# Patient Record
Sex: Male | Born: 1995 | Race: Black or African American | Hispanic: No | Marital: Single | Smoking: Never smoker
Health system: Southern US, Community
[De-identification: ages and names within clinical notes are randomized; demographics above are authoritative.]

## PROBLEM LIST (undated history)

## (undated) DIAGNOSIS — G4730 Sleep apnea, unspecified: Secondary | ICD-10-CM

## (undated) DIAGNOSIS — K219 Gastro-esophageal reflux disease without esophagitis: Secondary | ICD-10-CM

## (undated) DIAGNOSIS — J45909 Unspecified asthma, uncomplicated: Secondary | ICD-10-CM

## (undated) HISTORY — PX: TONSILLECTOMY: SUR1361

---
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
## 2015-02-22 ENCOUNTER — Other Ambulatory Visit (HOSPITAL_COMMUNITY): Payer: Self-pay | Admitting: Respiratory Therapy

## 2015-02-22 DIAGNOSIS — R0602 Shortness of breath: Secondary | ICD-10-CM

## 2016-04-20 ENCOUNTER — Encounter: Payer: Self-pay | Admitting: Gastroenterology

## 2016-04-21 ENCOUNTER — Other Ambulatory Visit (HOSPITAL_COMMUNITY): Payer: Self-pay | Admitting: Pulmonary Disease

## 2016-04-21 DIAGNOSIS — R1012 Left upper quadrant pain: Secondary | ICD-10-CM

## 2016-05-14 ENCOUNTER — Ambulatory Visit: Payer: BC Managed Care – PPO | Admitting: Nurse Practitioner

## 2016-05-15 ENCOUNTER — Other Ambulatory Visit: Payer: Self-pay | Admitting: Occupational Medicine

## 2016-05-15 ENCOUNTER — Ambulatory Visit: Payer: Self-pay

## 2016-05-15 DIAGNOSIS — R0781 Pleurodynia: Secondary | ICD-10-CM

## 2017-01-17 ENCOUNTER — Emergency Department (HOSPITAL_COMMUNITY)
Admission: EM | Admit: 2017-01-17 | Discharge: 2017-01-17 | Disposition: A | Discharge status: Home / Self Care | Payer: No Typology Code available for payment source | Attending: Emergency Medicine | Admitting: Emergency Medicine

## 2017-01-17 ENCOUNTER — Emergency Department (HOSPITAL_COMMUNITY): Payer: No Typology Code available for payment source

## 2017-01-17 DIAGNOSIS — M25561 Pain in right knee: Secondary | ICD-10-CM | POA: Diagnosis not present

## 2017-01-17 DIAGNOSIS — Y939 Activity, unspecified: Secondary | ICD-10-CM | POA: Insufficient documentation

## 2017-01-17 DIAGNOSIS — Y999 Unspecified external cause status: Secondary | ICD-10-CM | POA: Diagnosis not present

## 2017-01-17 DIAGNOSIS — J45909 Unspecified asthma, uncomplicated: Secondary | ICD-10-CM | POA: Diagnosis not present

## 2017-01-17 DIAGNOSIS — M542 Cervicalgia: Secondary | ICD-10-CM

## 2017-01-17 DIAGNOSIS — Y92410 Unspecified street and highway as the place of occurrence of the external cause: Secondary | ICD-10-CM | POA: Diagnosis not present

## 2017-01-17 DIAGNOSIS — Z79899 Other long term (current) drug therapy: Secondary | ICD-10-CM | POA: Diagnosis not present

## 2017-01-17 DIAGNOSIS — S199XXA Unspecified injury of neck, initial encounter: Secondary | ICD-10-CM | POA: Diagnosis not present

## 2017-01-17 DIAGNOSIS — M545 Low back pain, unspecified: Secondary | ICD-10-CM

## 2017-01-17 MED ORDER — CYCLOBENZAPRINE HCL 10 MG PO TABS: 10.0000 mg | ORAL_TABLET | Freq: Every day | ORAL | 0 refills | Status: AC
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
## 2017-01-17 NOTE — Discharge Instructions (Signed)
Alternate ibuprofen and Tylenol every 3 hours for pain control and take Flexeril at night for muscle relaxation but do not drive or operate machinery with Flexeril use. Ice to areas of soreness for the next few days and then may move to heat. Expect to be sore for the next few day and follow up with primary care physician or orthopedic doctor for recheck of ongoing symptoms but return to ER for emergent changing or worsening of symptoms.
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
## 2017-09-01 ENCOUNTER — Other Ambulatory Visit (HOSPITAL_BASED_OUTPATIENT_CLINIC_OR_DEPARTMENT_OTHER): Payer: Self-pay

## 2017-09-01 DIAGNOSIS — G4730 Sleep apnea, unspecified: Secondary | ICD-10-CM

## 2017-09-02 ENCOUNTER — Encounter (INDEPENDENT_AMBULATORY_CARE_PROVIDER_SITE_OTHER): Payer: Self-pay

## 2017-09-02 ENCOUNTER — Ambulatory Visit: Payer: BC Managed Care – PPO | Attending: Pulmonary Disease | Admitting: Neurology

## 2017-09-02 DIAGNOSIS — G4730 Sleep apnea, unspecified: Secondary | ICD-10-CM

## 2017-09-02 DIAGNOSIS — Z79899 Other long term (current) drug therapy: Secondary | ICD-10-CM | POA: Insufficient documentation

## 2017-09-02 DIAGNOSIS — G4733 Obstructive sleep apnea (adult) (pediatric): Secondary | ICD-10-CM | POA: Insufficient documentation

## 2017-09-05 NOTE — Procedures (Signed)
[facility] A. Merlene Laughter, Tristan Vance     [email or web address]             NOCTURNAL POLYSOMNOGRAPHY   LOCATION: [facility]   Patient Name: Tristan Vance, Tristan Vance Date: 09/02/2017 Gender: Male D.O.B: [birth year] Age (years): 21 Referring Provider: Sinda Du Height (inches): 72 Interpreting Physician: Tristan Vance, Tristan Vance Weight (lbs): 352 RPSGT: Peak, Robert BMI: 48 MRN: [id number] Neck Size: 18.00 CLINICAL INFORMATION Sleep Study Type: Split Night CPAP  Indication for sleep study: N/A  Epworth Sleepiness Score: 9  SLEEP STUDY TECHNIQUE As per the AASM Manual for the Scoring of Sleep and Associated Events v2.3 (April 2016) with a hypopnea requiring 4% desaturations.  The channels recorded and monitored were frontal, central and occipital EEG, electrooculogram (EOG), submentalis EMG (chin), nasal and oral airflow, thoracic and abdominal wall motion, anterior tibialis EMG, snore microphone, electrocardiogram, and pulse oximetry. Continuous positive airway pressure (CPAP) was initiated when the patient met split night criteria and was titrated according to treat sleep-disordered breathing.  MEDICATIONS Medications self-administered by patient taken the night of the study : N/A  Current Outpatient Medications:  .  bethanechol (URECHOLINE) 5 MG tablet, Take 1 tablet (5 mg total) by mouth 3 (three) times daily before meals., Disp: 100 tablet, Rfl: 5 .  cetirizine (ZYRTEC) 10 MG tablet, Take 10 mg by mouth daily., Disp: , Rfl:  .  esomeprazole (NEXIUM) 40 MG capsule, Take 1 capsule (40 mg total) by mouth daily., Disp: 30 capsule, Rfl: 0 .  mometasone (ELOCON) 0.1 % cream, Apply 1 application topically daily., Disp: , Rfl:   RESPIRATORY PARAMETERS Diagnostic  Total AHI (/hr): 29.2 RDI (/hr): 30.8 OA Index (/hr): 14 CA Index (/hr): 0.6 REM AHI (/hr): 51.4 NREM AHI (/hr): 28.8 Supine AHI (/hr): 42.4 Non-supine AHI (/hr): 19.63 Min O2 Sat (%): 79.00 Mean O2  (%): 93.19 Time below 88% (min): 10.7   Titration  Optimal Pressure (cm): 11 AHI at Optimal Pressure (/hr): 1.3 Min O2 at Optimal Pressure (%): 92.0 Supine % at Optimal (%): 100 Sleep % at Optimal (%): 99   SLEEP ARCHITECTURE The recording time for the entire night was 404.2 minutes.  During a baseline period of 238.6 minutes, the patient slept for 189.0 minutes in REM and nonREM, yielding a sleep efficiency of 79.2%. Sleep onset after lights out was 0.6 minutes with a REM latency of 138.5 minutes. The patient spent 10.05% of the night in stage N1 sleep, 73.54% in stage N2 sleep, 14.55% in stage N3 and 1.85% in REM.  During the titration period of 161.1 minutes, the patient slept for 131.5 minutes in REM and nonREM, yielding a sleep efficiency of 81.6%. Sleep onset after CPAP initiation was 3.0 minutes with a REM latency of 40.5 minutes. The patient spent 2.66% of the night in stage N1 sleep, 41.44% in stage N2 sleep, 21.29% in stage N3 and 34.60% in REM.  CARDIAC DATA The 2 lead EKG demonstrated sinus rhythm. The mean heart rate was 75.45 beats per minute. Other EKG findings include: None. LEG MOVEMENT DATA The total Periodic Limb Movements of Sleep (PLMS) were 0. The PLMS index was 0.00.  IMPRESSIONS Moderate obstructive sleep apnea occurred during the diagnostic portion of the study(AHI = 29.2/hour). The optimal CPAP is  ( 11 cm of water).  Tristan Metz, Tristan Vance Diplomate, American Board of Sleep Medicine.  ELECTRONICALLY SIGNED ON:  09/05/2017, 9:31 PM [place] PH: [phone]   FX: [phone] ACCREDITED BY THE  [place]

## 2017-12-09 ENCOUNTER — Ambulatory Visit: Payer: Self-pay | Admitting: Nutrition

## 2017-12-09 ENCOUNTER — Telehealth: Payer: Self-pay | Admitting: Nutrition

## 2017-12-09 NOTE — Telephone Encounter (Signed)
Unable to leave VM. Phone busy

## 2018-12-29 ENCOUNTER — Emergency Department (HOSPITAL_COMMUNITY): Payer: BC Managed Care – PPO

## 2018-12-29 ENCOUNTER — Other Ambulatory Visit: Payer: Self-pay

## 2018-12-29 ENCOUNTER — Emergency Department (HOSPITAL_COMMUNITY)
Admission: EM | Admit: 2018-12-29 | Discharge: 2018-12-29 | Disposition: A | Discharge status: Home / Self Care | Payer: BC Managed Care – PPO | Attending: Emergency Medicine | Admitting: Emergency Medicine

## 2018-12-29 ENCOUNTER — Encounter (HOSPITAL_COMMUNITY): Payer: Self-pay | Admitting: Emergency Medicine

## 2018-12-29 DIAGNOSIS — Z79899 Other long term (current) drug therapy: Secondary | ICD-10-CM | POA: Diagnosis not present

## 2018-12-29 DIAGNOSIS — J45909 Unspecified asthma, uncomplicated: Secondary | ICD-10-CM | POA: Diagnosis not present

## 2018-12-29 DIAGNOSIS — R3121 Asymptomatic microscopic hematuria: Secondary | ICD-10-CM | POA: Insufficient documentation

## 2018-12-29 DIAGNOSIS — R319 Hematuria, unspecified: Secondary | ICD-10-CM | POA: Diagnosis present

## 2018-12-29 LAB — URINALYSIS, ROUTINE W REFLEX MICROSCOPIC
Bacteria, UA: NONE SEEN
Bilirubin Urine: NEGATIVE
Glucose, UA: NEGATIVE mg/dL
Ketones, ur: NEGATIVE mg/dL
Leukocytes,Ua: NEGATIVE
Nitrite: NEGATIVE
Protein, ur: NEGATIVE mg/dL
Specific Gravity, Urine: 1.018 (ref 1.005–1.030)
pH: 6 (ref 5.0–8.0)

## 2018-12-29 NOTE — ED Triage Notes (Signed)
Pt from home. Pt c/o of having blood in urine x 2 weeks.

## 2018-12-29 NOTE — ED Provider Notes (Signed)
[facility] EMERGENCY DEPARTMENT Provider Note   CSN: [id number] Arrival date & time: 12/29/18  1602    History   Chief Complaint Chief Complaint  Patient presents with  . Hematuria    HPI Tristan Vance is a 23 y.o. male.     Patient reports two weeks of intermittent hematuria with right flank pain. Pain is non-radiating. No fevers or chills. Is sexually active, occasionally unprotected. Last activity 4 months ago. History of urethral stricture surgery 3 years ago.  The history is provided by the patient. No language interpreter was used.  Hematuria  This is a new problem. The current episode started more than 1 week ago. The problem occurs daily. The problem has not changed since onset.Pertinent negatives include no abdominal pain.    Past Medical History:  Diagnosis Date  . Acid reflux   . Asthma   . Sleep apnea     Patient Active Problem List   Diagnosis Date Noted  . GERD (gastroesophageal reflux disease) 12/11/2013    Past Surgical History:  Procedure Laterality Date  . TONSILLECTOMY          Home Medications    Prior to Admission medications   Medication Sig Start Date End Date Taking? Authorizing Provider  bethanechol (URECHOLINE) 5 MG tablet Take 1 tablet (5 mg total) by mouth 3 (three) times daily before meals. 12/11/13 12/12/14  Jon Gills, MD  cetirizine (ZYRTEC) 10 MG tablet Take 10 mg by mouth daily.    [provider]  esomeprazole (NEXIUM) 40 MG capsule Take 1 capsule (40 mg total) by mouth daily. 11/01/13   Burgess Amor, PA-C  mometasone (ELOCON) 0.1 % cream Apply 1 application topically daily.    [provider]    Family History Family History  Problem Relation Age of Onset  . Lactose intolerance Father   . GER disease Paternal Uncle   . Celiac disease Neg Hx   . Ulcers Neg Hx   . Cholelithiasis Neg Hx     Social History Social History   Tobacco Use  . Smoking status: Never Smoker  . Smokeless tobacco:  Never Used  Substance Use Topics  . Alcohol use: No  . Drug use: No     Allergies   Patient has no known allergies.   Review of Systems Review of Systems  Gastrointestinal: Negative for abdominal pain.  Genitourinary: Positive for hematuria. Negative for difficulty urinating, discharge and testicular pain.  All other systems reviewed and are negative.    Physical Exam Updated Vital Signs Ht 6\' 1"  (1.854 m)   Wt (!) 167.8 kg   BMI 48.82 kg/m   Physical Exam Vitals signs reviewed.  Constitutional:      General: He is not in acute distress.    Appearance: He is not ill-appearing.  HENT:     Head: Normocephalic.  Eyes:     Conjunctiva/sclera: Conjunctivae normal.  Cardiovascular:     Rate and Rhythm: Normal rate and regular rhythm.  Pulmonary:     Effort: Pulmonary effort is normal.     Breath sounds: Normal breath sounds.  Abdominal:     Palpations: Abdomen is soft.     Tenderness: There is no right CVA tenderness or left CVA tenderness.  Skin:    General: Skin is warm and dry.  Neurological:     Mental Status: He is alert and oriented to person, place, and time.  Psychiatric:        Mood and Affect: Mood normal.  ED Treatments / Results  Labs (all labs ordered are listed, but only abnormal results are displayed) Labs Reviewed  URINALYSIS, ROUTINE W REFLEX MICROSCOPIC - Abnormal; Notable for the following components:      Result Value   Hgb urine dipstick MODERATE (*)    All other components within normal limits  GC/CHLAMYDIA PROBE AMP ([place]) NOT [facility]    EKG None  Radiology Ct Renal Stone Study  Result Date: 12/29/2018 CLINICAL DATA:  Hematuria and flank pain EXAM: CT ABDOMEN AND PELVIS WITHOUT CONTRAST TECHNIQUE: Multidetector CT imaging of the abdomen and pelvis was performed following the standard protocol without IV contrast. COMPARISON:  None. FINDINGS: LOWER CHEST: There is no basilar pleural or apical pericardial effusion.  HEPATOBILIARY: Diffuse hypoattenuation of the liver relative to the spleen suggests hepatic steatosis. No focal liver lesion or biliary dilatation. The gallbladder is normal. PANCREAS: The pancreatic parenchymal contours are normal and there is no ductal dilatation. There is no peripancreatic fluid collection. SPLEEN: Normal. ADRENALS/URINARY TRACT: --Adrenal glands: Normal. --Right kidney/ureter: No hydronephrosis, nephroureterolithiasis, perinephric stranding or solid renal mass. --Left kidney/ureter: No hydronephrosis, nephroureterolithiasis, perinephric stranding or solid renal mass. --Urinary bladder: Normal for degree of distention STOMACH/BOWEL: --Stomach/Duodenum: There is no hiatal hernia or other gastric abnormality. The duodenal course and caliber are normal. --Small bowel: No dilatation or inflammation. --Colon: No focal abnormality. --Appendix: Normal. VASCULAR/LYMPHATIC: Normal course and caliber of the major abdominal vessels. No abdominal or pelvic lymphadenopathy. REPRODUCTIVE: Normal prostate size with symmetric seminal vesicles. MUSCULOSKELETAL. No bony spinal canal stenosis or focal osseous abnormality. Sclerotic focus within the L4 vertebral body, likely enostosis. OTHER: None. IMPRESSION: 1. No obstructive uropathy or nephrolithiasis. 2. Hepatic steatosis. Electronically Signed   By: Deatra RobinsonKevin  Herman M.D.   On: 12/29/2018 18:55    Procedures Procedures (including critical care time)  Medications Ordered in ED Medications - No data to display   Initial Impression / Assessment and Plan / ED Course  I have reviewed the triage vital signs and the nursing notes.  Pertinent labs & imaging results that were available during my care of the patient were reviewed by me and considered in my medical decision making (see chart for details).        Patient discussed with Dr. Effie ShyWentz.   Patient with asymptomatic hematuria. No fevers or chills. No indication of urinary tract infection. Renal  stone study does not reveal obstructive neuropathy or stones. STD testing pending. No clear cause of the hematuria at present. Patient to follow-up with urology. Patient safe for discharge at this time.  Final Clinical Impressions(s) / ED Diagnoses   Final diagnoses:  Asymptomatic microscopic hematuria    ED Discharge Orders    None       Felicie MornSmith, Camy Leder, NP 12/29/18 Babette [id number]    Mancel BaleWentz, Elliott, MD 12/30/18 1101

## 2018-12-29 NOTE — Discharge Instructions (Signed)
There is no indication of urinary tract infection. The kidneys and renal system appear normal on the CT scan. An incidental finding on the CT is hepatic steatosis ("fatty liver"). I have attached an information sheet for blood in the urine and fatty liver. Please follow-up with urology as discussed for the blood in your urine.

## 2018-12-30 LAB — GC/CHLAMYDIA PROBE AMP (~~LOC~~) NOT AT ARMC
Chlamydia: NEGATIVE
Neisseria Gonorrhea: NEGATIVE

## 2019-01-18 ENCOUNTER — Emergency Department (HOSPITAL_COMMUNITY)
Admission: EM | Admit: 2019-01-18 | Discharge: 2019-01-18 | Disposition: A | Discharge status: Home / Self Care | Payer: BC Managed Care – PPO | Attending: Emergency Medicine | Admitting: Emergency Medicine

## 2019-01-18 ENCOUNTER — Other Ambulatory Visit: Payer: Self-pay

## 2019-01-18 ENCOUNTER — Encounter (HOSPITAL_COMMUNITY): Payer: Self-pay | Admitting: *Deleted

## 2019-01-18 DIAGNOSIS — R319 Hematuria, unspecified: Secondary | ICD-10-CM | POA: Insufficient documentation

## 2019-01-18 DIAGNOSIS — J45909 Unspecified asthma, uncomplicated: Secondary | ICD-10-CM | POA: Diagnosis not present

## 2019-01-18 DIAGNOSIS — R350 Frequency of micturition: Secondary | ICD-10-CM | POA: Diagnosis not present

## 2019-01-18 DIAGNOSIS — Z79899 Other long term (current) drug therapy: Secondary | ICD-10-CM | POA: Diagnosis not present

## 2019-01-18 DIAGNOSIS — R300 Dysuria: Secondary | ICD-10-CM | POA: Diagnosis present

## 2019-01-18 LAB — BASIC METABOLIC PANEL
Anion gap: 8 (ref 5–15)
BUN: 9 mg/dL (ref 6–20)
CO2: 27 mmol/L (ref 22–32)
Calcium: 9.6 mg/dL (ref 8.9–10.3)
Chloride: 102 mmol/L (ref 98–111)
Creatinine, Ser: 1.03 mg/dL (ref 0.61–1.24)
GFR calc Af Amer: >60 mL/min (ref >60)
GFR calc non Af Amer: >60 mL/min (ref >60)
Glucose, Bld: 110 mg/dL — ABNORMAL HIGH (ref 70–99)
Potassium: 4.1 mmol/L (ref 3.5–5.1)
Sodium: 137 mmol/L (ref 135–145)

## 2019-01-18 LAB — CBC WITH DIFFERENTIAL/PLATELET
Abs Immature Granulocytes: 0.02 10*3/uL (ref 0.00–0.07)
Basophils Absolute: 0.1 10*3/uL (ref 0.0–0.1)
Basophils Relative: 1 %
Eosinophils Absolute: 0.3 10*3/uL (ref 0.0–0.5)
Eosinophils Relative: 3 %
HCT: 45.6 % (ref 39.0–52.0)
Hemoglobin: 14.3 g/dL (ref 13.0–17.0)
Immature Granulocytes: 0 %
Lymphocytes Relative: 18 %
Lymphs Abs: 1.5 10*3/uL (ref 0.7–4.0)
MCH: 27.3 pg (ref 26.0–34.0)
MCHC: 31.4 g/dL (ref 30.0–36.0)
MCV: 87 fL (ref 80.0–100.0)
Monocytes Absolute: 0.6 10*3/uL (ref 0.1–1.0)
Monocytes Relative: 7 %
Neutro Abs: 5.8 10*3/uL (ref 1.7–7.7)
Neutrophils Relative %: 71 %
Platelets: 268 10*3/uL (ref 150–400)
RBC: 5.24 MIL/uL (ref 4.22–5.81)
RDW: 12.1 % (ref 11.5–15.5)
WBC: 8.2 10*3/uL (ref 4.0–10.5)
nRBC: 0 % (ref 0.0–0.2)

## 2019-01-18 LAB — URINALYSIS, ROUTINE W REFLEX MICROSCOPIC
Bilirubin Urine: NEGATIVE
Glucose, UA: NEGATIVE mg/dL
Hgb urine dipstick: NEGATIVE
Ketones, ur: NEGATIVE mg/dL
Leukocytes,Ua: NEGATIVE
Nitrite: NEGATIVE
Protein, ur: NEGATIVE mg/dL
Specific Gravity, Urine: 1.021 (ref 1.005–1.030)
pH: 6 (ref 5.0–8.0)

## 2019-01-18 NOTE — ED Notes (Signed)
ED Provider at bedside. 

## 2019-01-18 NOTE — ED Notes (Signed)
Pt was alert and no distress was noted when ambulated to exit.  

## 2019-01-18 NOTE — ED Notes (Signed)
Md at bedside

## 2019-01-18 NOTE — ED Notes (Signed)
Two foley attempts;16 french and 14 french; unsuccessful; MD notified

## 2019-01-18 NOTE — Discharge Instructions (Addendum)
Patient was experiencing urinary retention in the emergency department. After attempted insertion of Foley catheter, patient was able to urinate. Renal function and basic labs were reassuring in the emergency department.

## 2019-01-18 NOTE — ED Triage Notes (Signed)
Pt is here due to urinary frequency.  Pt states that he has to feel and goes to the bathroom where he is able to make small amounts of urine and it is painful when he does pee.  Pt states that he recently was seen for hematuria and had a UA and a CT scan.  Pt gave sample in triage, urine appears yellow and clear.  Pt reports lower back pain and also pain between rectum and testicles.  No recent unprotected sex and no penile discharge.

## 2019-01-18 NOTE — ED Notes (Signed)
Pt ambulated to bathroom x2. 

## 2019-01-18 NOTE — ED Provider Notes (Signed)
Emergency Department Provider Note  ____________________________________________  Time seen: Approximately 7:37 PM  I have reviewed the triage vital signs and the nursing notes.   HISTORY  Chief Complaint Urinary Frequency    HPI Tristan Vance is a 23 y.o. male with a history of urethral stricture, presents to the emergency department with dysuria and changes in urinary stream since 12/29/2018.  He has also noticed hematuria.  Patient was seen and evaluated on 12/29/2018 and had a CT renal stone study and urinalysis which was noncontributory for nephrolithiasis and cystitis.  Patient states that urinary strain and changes in stream have worsened and he is experiencing suprapubic discomfort from urinary retention.  Patient underwent a urethral dilation in 2017.  He is currently under the care of urology and has an appointment on 01/19/2019 at 1 PM with Dr. Santo Held.  He denies fever, chills, nausea and vomiting.  No other alleviating measures have been attempted.        Past Medical History:  Diagnosis Date  . Acid reflux   . Asthma   . Sleep apnea     Patient Active Problem List   Diagnosis Date Noted  . GERD (gastroesophageal reflux disease) 12/11/2013    Past Surgical History:  Procedure Laterality Date  . TONSILLECTOMY      Prior to Admission medications   Medication Sig Start Date End Date Taking? Authorizing Provider  cetirizine (ZYRTEC) 10 MG tablet Take 10 mg by mouth daily.   Yes [provider]  omeprazole (PRILOSEC) 20 MG capsule Take 20 mg by mouth daily.   Yes [provider]  esomeprazole (NEXIUM) 40 MG capsule Take 1 capsule (40 mg total) by mouth daily. Patient not taking: Reported on 01/18/2019 11/01/13   Burgess Amor, PA-C    Allergies Patient has no known allergies.  Family History  Problem Relation Age of Onset  . Lactose intolerance Father   . GER disease Paternal Uncle   . Celiac disease Neg Hx   . Ulcers Neg Hx   .  Cholelithiasis Neg Hx     Social History Social History   Tobacco Use  . Smoking status: Never Smoker  . Smokeless tobacco: Never Used  Substance Use Topics  . Alcohol use: No  . Drug use: No     Review of Systems  Constitutional: No fever/chills Eyes: No visual changes. No discharge ENT: No upper respiratory complaints. Cardiovascular: no chest pain. Respiratory: no cough. No SOB. Gastrointestinal: No abdominal pain.  No nausea, no vomiting.  No diarrhea.  No constipation. Genitourinary: Patient has dysuria, hematuria and increased urinary frequency.  Musculoskeletal: Negative for musculoskeletal pain. Skin: Negative for rash, abrasions, lacerations, ecchymosis. Neurological: Negative for headaches, focal weakness or numbness. .  ____________________________________________   PHYSICAL EXAM:  VITAL SIGNS: ED Triage Vitals  Enc Vitals Group     BP 01/18/19 1744 (!) 156/89     Pulse Rate 01/18/19 1744 (!) 118     Resp 01/18/19 1744 15     Temp 01/18/19 1744 98.4 F (36.9 C)     Temp Source 01/18/19 1744 Oral     SpO2 01/18/19 1744 93 %     Weight 01/18/19 1827 (!) 370 lb (167.8 kg)     Height 01/18/19 1827 6\' 1"  (1.854 m)     Head Circumference --      Peak Flow --      Pain Score 01/18/19 1826 9     Pain Loc --      Pain Edu? --  Excl. in GC? --      Constitutional: Alert and oriented. Well appearing and in no acute distress. Eyes: Conjunctivae are normal. PERRL. EOMI. Head: Atraumatic. Cardiovascular: Normal rate, regular rhythm. Normal S1 and S2.  Good peripheral circulation. Respiratory: Normal respiratory effort without tachypnea or retractions. Lungs CTAB. Good air entry to the bases with no decreased or absent breath sounds. Gastrointestinal: Bowel sounds 4 quadrants. Patient has suprapubic tenderness to palpation. No guarding or rigidity. No palpable masses. No distention. No CVA tenderness. Musculoskeletal: Full range of motion to all  extremities. No gross deformities appreciated. Neurologic:  Normal speech and language. No gross focal neurologic deficits are appreciated.  Skin:  Skin is warm, dry and intact. No rash noted. Psychiatric: Mood and affect are normal. Speech and behavior are normal. Patient exhibits appropriate insight and judgement.   ____________________________________________   LABS (all labs ordered are listed, but only abnormal results are displayed)  Labs Reviewed  BASIC METABOLIC PANEL - Abnormal; Notable for the following components:      Result Value   Glucose, Bld 110 (*)    All other components within normal limits  URINALYSIS, ROUTINE W REFLEX MICROSCOPIC  CBC WITH DIFFERENTIAL/PLATELET   ____________________________________________  EKG   ____________________________________________  RADIOLOGY  No results found.  ____________________________________________    PROCEDURES  Procedure(s) performed:    Procedures    Medications - No data to display   ____________________________________________   INITIAL IMPRESSION / ASSESSMENT AND PLAN / ED COURSE  Pertinent labs & imaging results that were available during my care of the patient were reviewed by me and considered in my medical decision making (see chart for details).  Review of the [place] CSRS was performed in accordance of the [facility] prior to dispensing any controlled drugs.           Assessment and Plan:  Increased urinary frequency 23 year old male presents to the emergency department with increased urinary frequency, changes in urinary stream and hematuria that has been occurring since 12/29/2018.  On physical exam, patient seemed uncomfortable and complained of suprapubic pain.  Initial bladder scan revealed over 500 mL of urine.  Differential diagnosis included urethral stricture, acute kidney injury, cystitis and nephrolithiasis.    Attempt at Foley catheter placement was made but patient became  uncomfortable.  He was able to urinate normally after attempt at Foley catheter placement was made and had only 17 mL of urine on repeat bladder scan.  Events in emergency department increase suspicion for urethral stricture. Renal function was reassuring and there was no evidence of cystitis on urinalysis.  Patient had CT renal stone study on 12/29/2018 which revealed no evidence of nephrolithiasis.  Patient has a follow-up appointment tomorrow with his urologist, Dr. Santo Held.  Patient declined further attempts at Foley placement, stating that he felt improvement in his symptoms after being able to fully void.  Patient stated that he felt comfortable following up with his urologist.  Strict return precautions were given to return to the emergency department for new or worsening symptoms.  All patient questions were answered.     ____________________________________________  FINAL CLINICAL IMPRESSION(S) / ED DIAGNOSES  Final diagnoses:  Urinary frequency      NEW MEDICATIONS STARTED DURING THIS VISIT:  ED Discharge Orders    None          This chart was dictated using voice recognition software/Dragon. Despite best efforts to proofread, errors can occur which can change the meaning. Any change was purely unintentional.  Pia MauWoods, Kashius Dominic Sunset ValleyM, PA-C 01/18/19 2235    Little, Ambrose Finlandachel Morgan, MD 01/20/19 [phone]

## 2019-06-05 ENCOUNTER — Encounter (HOSPITAL_COMMUNITY): Payer: Self-pay

## 2019-06-05 ENCOUNTER — Emergency Department (HOSPITAL_COMMUNITY)
Admission: EM | Admit: 2019-06-05 | Discharge: 2019-06-05 | Disposition: A | Discharge status: Home / Self Care | Payer: BC Managed Care – PPO | Attending: Emergency Medicine | Admitting: Emergency Medicine

## 2019-06-05 ENCOUNTER — Other Ambulatory Visit: Payer: Self-pay

## 2019-06-05 ENCOUNTER — Emergency Department (HOSPITAL_COMMUNITY): Payer: BC Managed Care – PPO

## 2019-06-05 DIAGNOSIS — J189 Pneumonia, unspecified organism: Secondary | ICD-10-CM | POA: Diagnosis not present

## 2019-06-05 DIAGNOSIS — Z79899 Other long term (current) drug therapy: Secondary | ICD-10-CM | POA: Diagnosis not present

## 2019-06-05 DIAGNOSIS — R739 Hyperglycemia, unspecified: Secondary | ICD-10-CM | POA: Diagnosis not present

## 2019-06-05 DIAGNOSIS — U071 COVID-19: Secondary | ICD-10-CM | POA: Diagnosis not present

## 2019-06-05 DIAGNOSIS — J1282 Pneumonia due to coronavirus disease 2019: Secondary | ICD-10-CM

## 2019-06-05 DIAGNOSIS — J45909 Unspecified asthma, uncomplicated: Secondary | ICD-10-CM | POA: Insufficient documentation

## 2019-06-05 DIAGNOSIS — R0602 Shortness of breath: Secondary | ICD-10-CM | POA: Diagnosis present

## 2019-06-05 LAB — CBC WITH DIFFERENTIAL/PLATELET
Abs Immature Granulocytes: 0.01 10*3/uL (ref 0.00–0.07)
Basophils Absolute: 0 10*3/uL (ref 0.0–0.1)
Basophils Relative: 1 %
Eosinophils Absolute: 0 10*3/uL (ref 0.0–0.5)
Eosinophils Relative: 0 %
HCT: 49.1 % (ref 39.0–52.0)
Hemoglobin: 16.3 g/dL (ref 13.0–17.0)
Immature Granulocytes: 0 %
Lymphocytes Relative: 36 %
Lymphs Abs: 1.3 10*3/uL (ref 0.7–4.0)
MCH: 28.3 pg (ref 26.0–34.0)
MCHC: 33.2 g/dL (ref 30.0–36.0)
MCV: 85.2 fL (ref 80.0–100.0)
Monocytes Absolute: 0.4 10*3/uL (ref 0.1–1.0)
Monocytes Relative: 12 %
Neutro Abs: 1.9 10*3/uL (ref 1.7–7.7)
Neutrophils Relative %: 51 %
Platelets: 235 10*3/uL (ref 150–400)
RBC: 5.76 MIL/uL (ref 4.22–5.81)
RDW: 12.5 % (ref 11.5–15.5)
WBC: 3.7 10*3/uL — ABNORMAL LOW (ref 4.0–10.5)
nRBC: 0 % (ref 0.0–0.2)

## 2019-06-05 LAB — COMPREHENSIVE METABOLIC PANEL
ALT: 34 U/L (ref 0–44)
AST: 27 U/L (ref 15–41)
Albumin: 4 g/dL (ref 3.5–5.0)
Alkaline Phosphatase: 66 U/L (ref 38–126)
Anion gap: 15 (ref 5–15)
BUN: 12 mg/dL (ref 6–20)
CO2: 21 mmol/L — ABNORMAL LOW (ref 22–32)
Calcium: 9.1 mg/dL (ref 8.9–10.3)
Chloride: 99 mmol/L (ref 98–111)
Creatinine, Ser: 1.02 mg/dL (ref 0.61–1.24)
GFR calc Af Amer: >60 mL/min (ref >60)
GFR calc non Af Amer: >60 mL/min (ref >60)
Glucose, Bld: 289 mg/dL — ABNORMAL HIGH (ref 70–99)
Potassium: 4.3 mmol/L (ref 3.5–5.1)
Sodium: 135 mmol/L (ref 135–145)
Total Bilirubin: 0.6 mg/dL (ref 0.3–1.2)
Total Protein: 7.6 g/dL (ref 6.5–8.1)

## 2019-06-05 MED ORDER — SODIUM CHLORIDE 0.9 % IV BOLUS
1000.0000 mL | Freq: Once | INTRAVENOUS | Status: AC
  Administered 2019-06-05: 1000 mL via INTRAVENOUS

## 2019-06-05 MED ORDER — AZITHROMYCIN 250 MG PO TABS: 250.0000 mg | ORAL_TABLET | Freq: Every day | ORAL | 0 refills | Status: DC

## 2019-06-05 MED ORDER — SODIUM CHLORIDE 0.9% FLUSH: 3.0000 mL | Freq: Once | INTRAVENOUS | Status: DC

## 2019-06-05 MED ORDER — ALBUTEROL SULFATE HFA 108 (90 BASE) MCG/ACT IN AERS
4.0000 | INHALATION_SPRAY | Freq: Once | RESPIRATORY_TRACT | Status: AC
  Administered 2019-06-05: 4 via RESPIRATORY_TRACT
  Filled 2019-06-05: qty 6.7

## 2019-06-05 NOTE — ED Notes (Signed)
Walking Sa02 94% on RA

## 2019-06-05 NOTE — ED Triage Notes (Signed)
Pt is COVID + and having SOBx 6 dsys. Pt states it's been getting worse. Pt states productive cough that is clear phlegm.  Pt is A&Ox4. Pt is having normal resp. Pt is NSR on monitor. Pt does not appear to be in any distress.

## 2019-06-05 NOTE — ED Provider Notes (Addendum)
[facility] EMERGENCY DEPARTMENT Provider Note   CSN: [id number] Arrival date & time: 06/05/19  2202     History   Chief Complaint Chief Complaint  Patient presents with  . Shortness of Breath    HPI Tristan Vance is a 23 y.o. male.     HPI  23 year old male presents with shortness of breath.  He states he was diagnosed with COVID-19 on September 30.  He was getting tested for an upcoming procedure and at the time of testing had no symptoms.  The next day after finding out his symptoms he developed some shortness of breath and has had some cough with clear sputum.  He has had a fever, most recently a temperature of 101 last night.  No vomiting but has had a few episodes of diarrhea each day for 3 days.  Some chest pain when he coughs only.  Shortness of breath is most prominent when he is ambulatory.  Past Medical History:  Diagnosis Date  . Acid reflux   . Asthma   . Sleep apnea     Patient Active Problem List   Diagnosis Date Noted  . GERD (gastroesophageal reflux disease) 12/11/2013    Past Surgical History:  Procedure Laterality Date  . TONSILLECTOMY          Home Medications    Prior to Admission medications   Medication Sig Start Date End Date Taking? Authorizing Provider  azithromycin (ZITHROMAX) 250 MG tablet Take 1 tablet (250 mg total) by mouth daily. Take first 2 tablets together, then 1 every day until finished. 06/05/19   Sherwood Gambler, MD  cetirizine (ZYRTEC) 10 MG tablet Take 10 mg by mouth daily.    [provider]  esomeprazole (NEXIUM) 40 MG capsule Take 1 capsule (40 mg total) by mouth daily. Patient not taking: Reported on 01/18/2019 11/01/13   Evalee Jefferson, PA-C  omeprazole (PRILOSEC) 20 MG capsule Take 20 mg by mouth daily.    [provider]    Family History Family History  Problem Relation Age of Onset  . Lactose intolerance Father   . GER disease Paternal Uncle   . Celiac disease Neg Hx   .  Ulcers Neg Hx   . Cholelithiasis Neg Hx     Social History Social History   Tobacco Use  . Smoking status: Never Smoker  . Smokeless tobacco: Never Used  Substance Use Topics  . Alcohol use: No  . Drug use: No     Allergies   Patient has no known allergies.   Review of Systems Review of Systems  Constitutional: Positive for fever.  Respiratory: Positive for cough and shortness of breath.   Cardiovascular: Positive for chest pain (with cough). Negative for leg swelling.  Gastrointestinal: Positive for diarrhea. Negative for abdominal pain.  All other systems reviewed and are negative.    Physical Exam Updated Vital Signs BP 135/67   Pulse 91   Temp 98.5 F (36.9 C) (Oral)   Resp (!) 27   Ht 6\' 2"  (1.88 m)   Wt (!) 158.8 kg   SpO2 93%   BMI 44.94 kg/m   Physical Exam Vitals signs and nursing note reviewed.  Constitutional:      General: He is not in acute distress.    Appearance: He is well-developed. He is obese. He is not ill-appearing or diaphoretic.  HENT:     Head: Normocephalic and atraumatic.     Right Ear: External ear normal.  Left Ear: External ear normal.     Nose: Nose normal.  Eyes:     General:        Right eye: No discharge.        Left eye: No discharge.  Neck:     Musculoskeletal: Neck supple.  Cardiovascular:     Rate and Rhythm: Normal rate and regular rhythm.     Heart sounds: Normal heart sounds.  Pulmonary:     Effort: Pulmonary effort is normal. Tachypnea (mild) present. No accessory muscle usage.     Breath sounds: Normal breath sounds.     Comments: Speaks in complete sentences Abdominal:     Palpations: Abdomen is soft.     Tenderness: There is no abdominal tenderness.  Skin:    General: Skin is warm and dry.  Neurological:     Mental Status: He is alert.  Psychiatric:        Mood and Affect: Mood is not anxious.      ED Treatments / Results  Labs (all labs ordered are listed, but only abnormal results are  displayed) Labs Reviewed  COMPREHENSIVE METABOLIC PANEL - Abnormal; Notable for the following components:      Result Value   CO2 21 (*)    Glucose, Bld 289 (*)    All other components within normal limits  CBC WITH DIFFERENTIAL/PLATELET - Abnormal; Notable for the following components:   WBC 3.7 (*)    All other components within normal limits    EKG EKG Interpretation  Date/Time:  Monday June 05 2019 10:21:44 EDT Ventricular Rate:  90 PR Interval:    QRS Duration: 92 QT Interval:  338 QTC Calculation: 414 R Axis:   24 Text Interpretation:  Sinus rhythm Probable left atrial enlargement no acute ST/T changes No old tracing to compare Confirmed by Pricilla LovelessGoldston, Aydian Dimmick [phone]([id number]) on 06/05/2019 10:39:25 AM   Radiology Dg Chest Portable 1 View  Result Date: 06/05/2019 CLINICAL DATA:  Covert positive. Shortness of breath for 6 days. Productive cough and clear phlegm. EXAM: PORTABLE CHEST 1 VIEW COMPARISON:  01/17/2017 FINDINGS: The heart size and mediastinal contours are within normal limits. Diffuse low lung volumes. Asymmetric opacity within the right midlung and right base concerning for pneumonia. The visualized skeletal structures are unremarkable. IMPRESSION: 1. Right mid and lower lungs airspace opacity compatible with pneumonia. Electronically Signed   By: Signa Kellaylor  Stroud M.D.   On: 06/05/2019 11:06    Procedures Procedures (including critical care time)  Medications Ordered in ED Medications  sodium chloride flush (NS) 0.9 % injection 3 mL (has no administration in time range)  sodium chloride 0.9 % bolus 1,000 mL (0 mLs Intravenous Stopped 06/05/19 1203)  albuterol (VENTOLIN HFA) 108 (90 Base) MCG/ACT inhaler 4 puff (4 puffs Inhalation Given 06/05/19 1042)     Initial Impression / Assessment and Plan / ED Course  I have reviewed the triage vital signs and the nursing notes.  Pertinent labs & imaging results that were available during my care of the patient were reviewed by me  and considered in my medical decision making (see chart for details).        On my assessment, patient's O2 sats are greater than 96%.  He is a little tachypneic, but in no distress.  Chest pain appears to be more from coughing.  ECG is benign.  Labs overall pretty benign except mild leukopenia and some hyperglycemia.  He will need this further worked up by his PCP.  Chest x-ray with  likely right mid/lower lobe pneumonia.  This could be from COVID or bacterial infection on top of it.  Given this, will supply with antibiotics and advised him to continue to home quarantine.  Return precautions discussed. He ambulated in room with pulse ox staying above 94%.  Khadeem Rockett was evaluated in Emergency Department on 06/05/2019 for the symptoms described in the history of present illness. He was evaluated in the context of the global COVID-19 pandemic, which necessitated consideration that the patient might be at risk for infection with the SARS-CoV-2 virus that causes COVID-19. Institutional protocols and algorithms that pertain to the evaluation of patients at risk for COVID-19 are in a state of rapid change based on information released by regulatory bodies including the CDC and federal and state organizations. These policies and algorithms were followed during the patient's care in the ED.   Final Clinical Impressions(s) / ED Diagnoses   Final diagnoses:  Pneumonia due to COVID-19 virus  Hyperglycemia    ED Discharge Orders         Ordered    azithromycin (ZITHROMAX) 250 MG tablet  Daily     06/05/19 1150           Pricilla Loveless, MD 06/05/19 1213    Pricilla Loveless, MD 06/05/19 1213

## 2019-06-05 NOTE — Discharge Instructions (Signed)
The pneumonia is probably from the COVID-19 infection but could be from bacteria, so you are being prescribed antibiotics.  You may use the inhaler given every 4 hours as needed for shortness of breath.  Use 1-2 puffs.  If you develop new or worsening shortness of breath, coughing blood, or any other new/concerning symptoms then return to the ER for evaluation. Continue to self quarantine.  Your glucose today was elevated and you will need further work-up for diabetes by your primary care physician.

## 2019-07-18 ENCOUNTER — Ambulatory Visit: Payer: BC Managed Care – PPO

## 2019-07-25 ENCOUNTER — Ambulatory Visit: Payer: BC Managed Care – PPO

## 2019-08-01 ENCOUNTER — Ambulatory Visit: Payer: BC Managed Care – PPO

## 2019-09-13 ENCOUNTER — Ambulatory Visit: Payer: BC Managed Care – PPO | Admitting: Registered"

## 2019-11-01 ENCOUNTER — Other Ambulatory Visit (HOSPITAL_BASED_OUTPATIENT_CLINIC_OR_DEPARTMENT_OTHER): Payer: Self-pay

## 2019-11-01 DIAGNOSIS — R0683 Snoring: Secondary | ICD-10-CM

## 2019-11-01 DIAGNOSIS — G4710 Hypersomnia, unspecified: Secondary | ICD-10-CM

## 2019-11-01 DIAGNOSIS — G4730 Sleep apnea, unspecified: Secondary | ICD-10-CM

## 2019-11-18 ENCOUNTER — Other Ambulatory Visit (HOSPITAL_COMMUNITY)
Admission: RE | Admit: 2019-11-18 | Discharge: 2019-11-18 | Disposition: A | Discharge status: Home / Self Care | Payer: BC Managed Care – PPO | Source: Ambulatory Visit | Attending: Internal Medicine | Admitting: Internal Medicine

## 2019-11-18 DIAGNOSIS — Z20822 Contact with and (suspected) exposure to covid-19: Secondary | ICD-10-CM | POA: Insufficient documentation

## 2019-11-18 DIAGNOSIS — Z01812 Encounter for preprocedural laboratory examination: Secondary | ICD-10-CM | POA: Insufficient documentation

## 2019-11-18 LAB — SARS CORONAVIRUS 2 (TAT 6-24 HRS): SARS Coronavirus 2: NEGATIVE

## 2019-11-20 ENCOUNTER — Other Ambulatory Visit: Payer: Self-pay

## 2019-11-20 ENCOUNTER — Ambulatory Visit (HOSPITAL_BASED_OUTPATIENT_CLINIC_OR_DEPARTMENT_OTHER): Payer: BC Managed Care – PPO | Attending: Internal Medicine | Admitting: Internal Medicine

## 2019-11-20 DIAGNOSIS — G4733 Obstructive sleep apnea (adult) (pediatric): Secondary | ICD-10-CM | POA: Insufficient documentation

## 2019-11-20 DIAGNOSIS — G4710 Hypersomnia, unspecified: Secondary | ICD-10-CM

## 2019-11-20 DIAGNOSIS — G4730 Sleep apnea, unspecified: Secondary | ICD-10-CM

## 2019-11-20 DIAGNOSIS — R0683 Snoring: Secondary | ICD-10-CM

## 2019-11-21 NOTE — Procedures (Signed)
   NAME: Tristan Vance DATE OF BIRTH:  1996-07-01 MEDICAL RECORD NUMBER [id number]  LOCATION: [facility]  PHYSICIAN: Deretha Emory  DATE OF STUDY: 11/20/2019  SLEEP STUDY TYPE: Positive Airway Pressure Titration               REFERRING PHYSICIAN: Deretha Emory, MD  INDICATION FOR STUDY: Failure to tolerate APAP; history of moderate OSA  EPWORTH SLEEPINESS SCORE:  NA HEIGHT: 6\' 2"  (188 cm)  WEIGHT: (!) 370 lb (167.8 kg)    Body mass index is 47.51 kg/m.  NECK SIZE: 19.5 in.  MEDICATIONS Patient self administered medications include: N/A. Medications administered during study include No sleep medicine administered.  SLEEP STUDY TECHNIQUE The patient underwent an attended overnight polysomnography titration to assess the effects of cpap therapy. The following variables were monitored: EEG(C4-A1, C3-A2, O1-A2, O2-A1), EOG, submental and leg EMG, ECG, oxyhemoglobin saturation by pulse oximetry, thoracic and abdominal respiratory effort belts, nasal/oral airflow by pressure sensor, body position sensor and snoring sensor. CPAP pressure was titrated to eliminate apneas, hypopneas and oxygen desaturation.  TECHNICAL COMMENTS Comments added by Technician: None Comments added by Scorer: N/A  SLEEP ARCHITECTURE The study was initiated at 10:49:17 PM and terminated at 4:56:58 AM. Total recorded time was 367.7 minutes. EEG confirmed total sleep time was 342.7 minutes yielding a sleep efficiency of 93.2%%. Sleep onset after lights out was 3.0 minutes with a REM latency of 196.0 minutes. The patient spent 4.2%% of the night in stage N1 sleep, 66.6%% in stage N2 sleep, 15.9%% in stage N3 and 13.3% in REM. The Arousal Index was 3.0/hour.  RESPIRATORY PARAMETERS The overall AHI was 2.3 per hour, and the RDI was 2.5 events/hour with a central apnea index of 0.2per hour. The most appropriate setting of CPAP was IPAP/EPAP 16/16 cm H2O. At this setting, the sleep efficiency  was 92% and the patient was supine for 100%. The AHI was 0.0 events per hour, and the RDI was 0.4 events/hour (with 0.2 central events) and the arousal index was 1.6 per hour.The oxygen nadir was 92.0% during sleep. It should be noted that CPAP pressures of 14 and above also gave acceptable airway control (AHI < 5/hr).   LEG MOVEMENT DATA The total leg movements were 0 with a resulting leg movement index of 0.0/hr. Associated arousal with leg movement index was 0.0/hr.  CARDIAC DATA The underlying cardiac rhythm was most consistent with sinus rhythm. Mean heart rate during sleep was 66.9 bpm. Additional rhythm abnormalities include None.  IMPRESSIONS - Successful CPAP titration. Patient observed he was able to keep CPAP on the entire night.  DIAGNOSIS - Obstructive Sleep Apnea (327.23 [G47.33 ICD-10])  RECOMMENDATIONS - CPAP therapy on 14-16 cm H2O or auto adjusting CPAP with a Large size [employer] Full Face Mask Simplus mask and heated humidification.  12-19-1992 Sleep specialist, American Board of Internal Medicine  ELECTRONICALLY SIGNED ON:  11/21/2019, 8:11 PM [facility] PH: [phone]   FX: [phone] ACCREDITED BY THE AMERICAN ACADEMY OF SLEEP MEDICINE

## 2020-03-07 IMAGING — CT CT RENAL STONE PROTOCOL
2 of 4 series · 16 of 46 positions shown, 18 images · non-contrast
Comparison: None.

CLINICAL DATA: Hematuria and flank pain

EXAM:
CT ABDOMEN AND PELVIS WITHOUT CONTRAST
TECHNIQUE: Multidetector CT imaging of the abdomen and pelvis was performed
following the standard protocol without IV contrast.

[Series 2: axial st · axial · 0.91mm/px · z∈[-274,+136]mm · 13 of 94 slices shown, 15 images]
[im 6/94  soft-tissue]
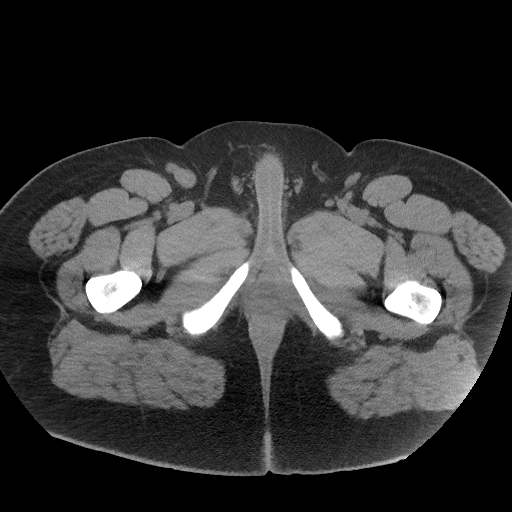
[im 6/94  bone]
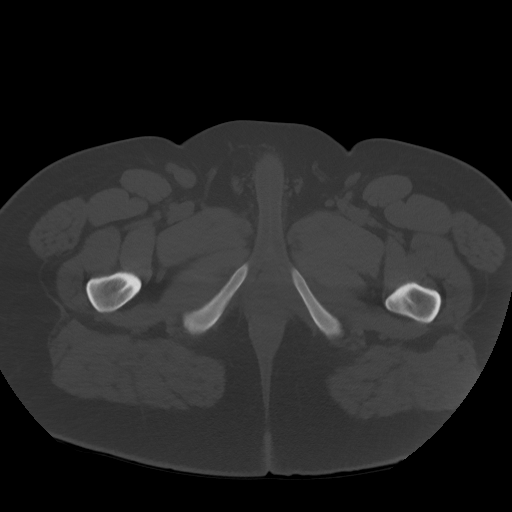
[im 11/94  soft-tissue]
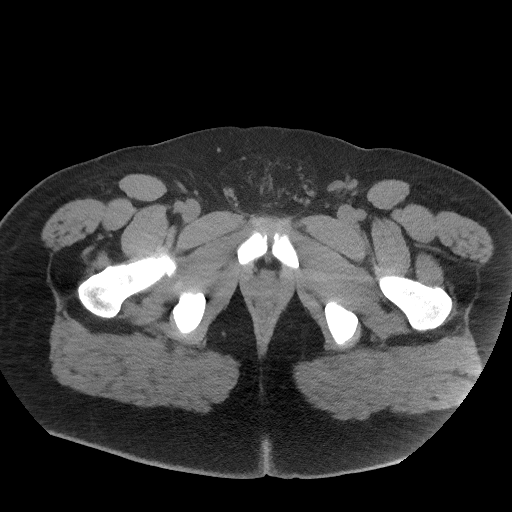
[im 21/94  soft-tissue]
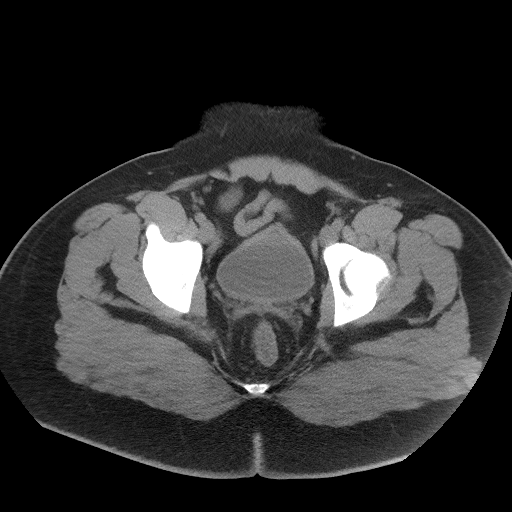
[im 26/94  soft-tissue]
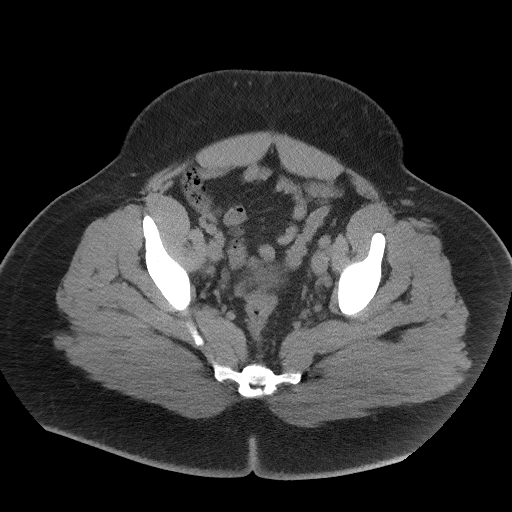
[im 32/94  soft-tissue]
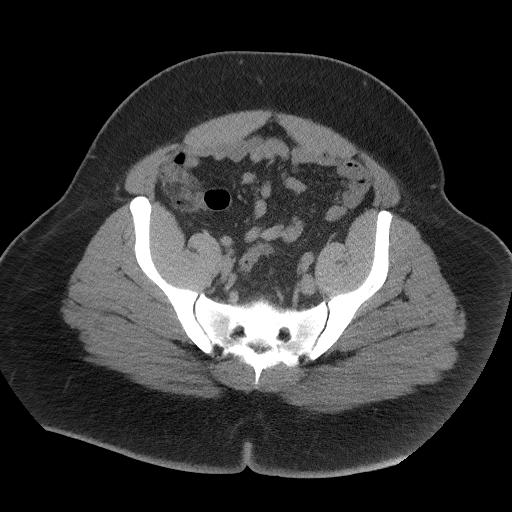
[im 42/94  soft-tissue]
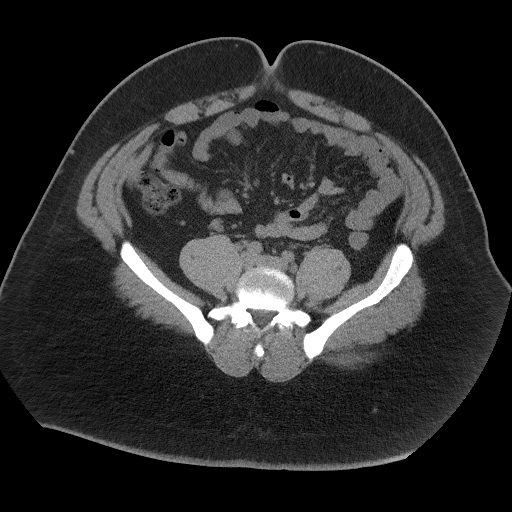
[im 47/94  soft-tissue]
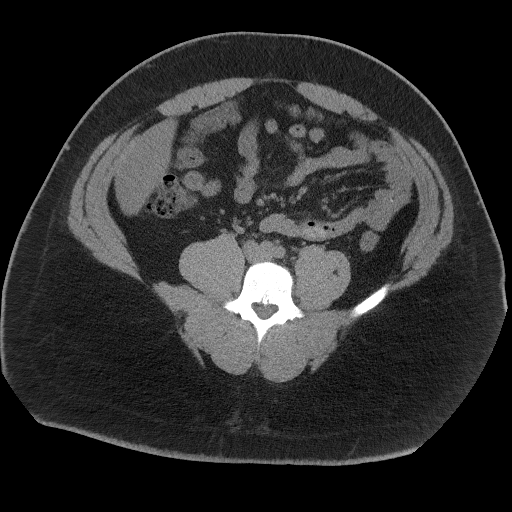
[im 52/94  soft-tissue]
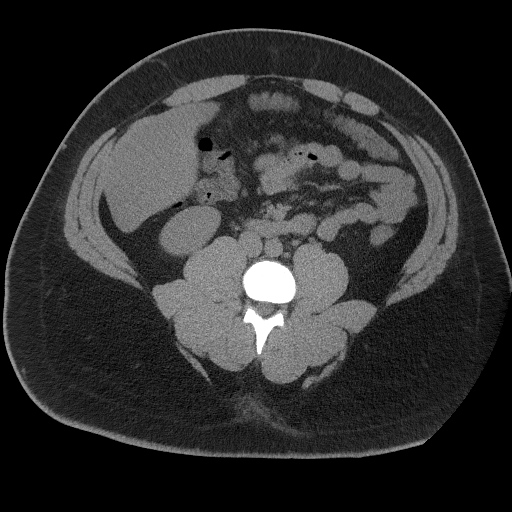
[im 63/94  soft-tissue]
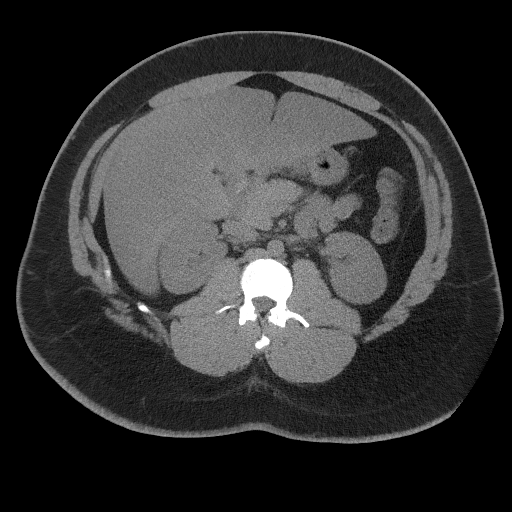
[im 63/94  bone]
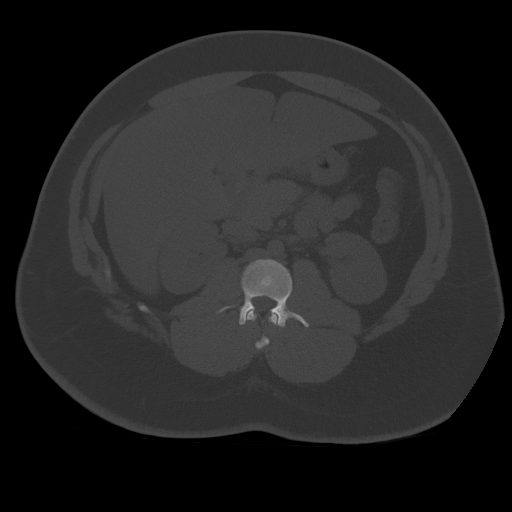
[im 68/94  soft-tissue]
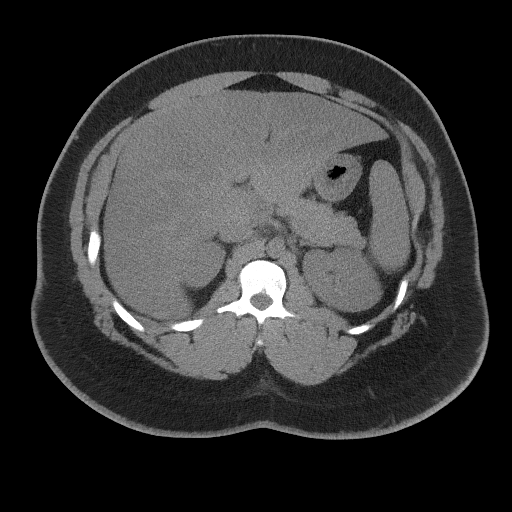
[im 73/94  soft-tissue]
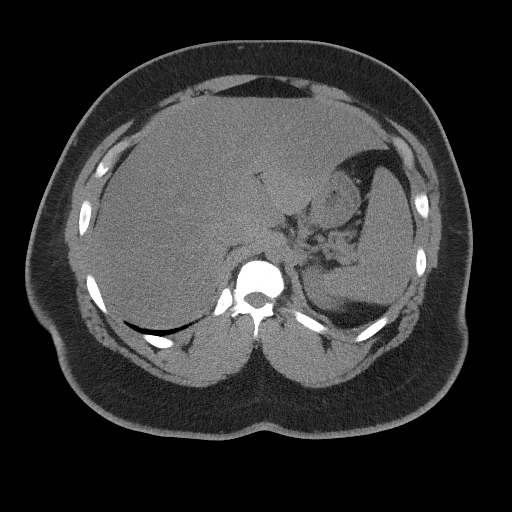
[im 83/94  soft-tissue]
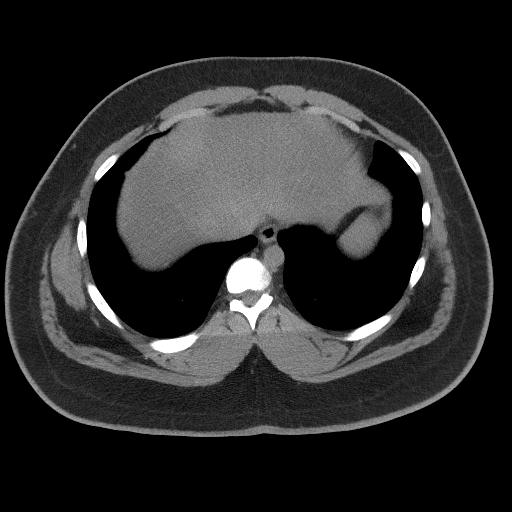
[im 88/94  soft-tissue]
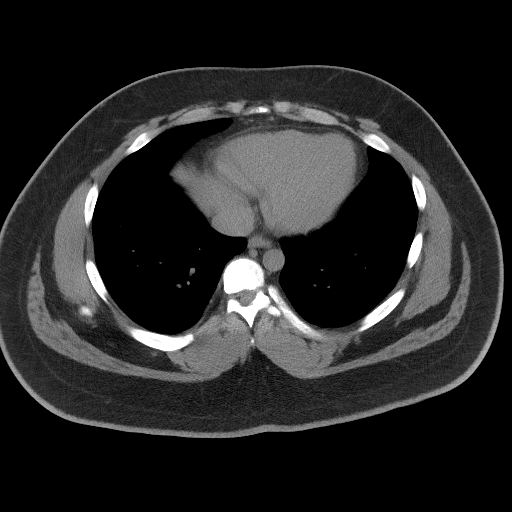

[Series 5: coronal st · coronal · 0.79mm/px · 3 of 121 slices shown]
[im 41/121  soft-tissue]
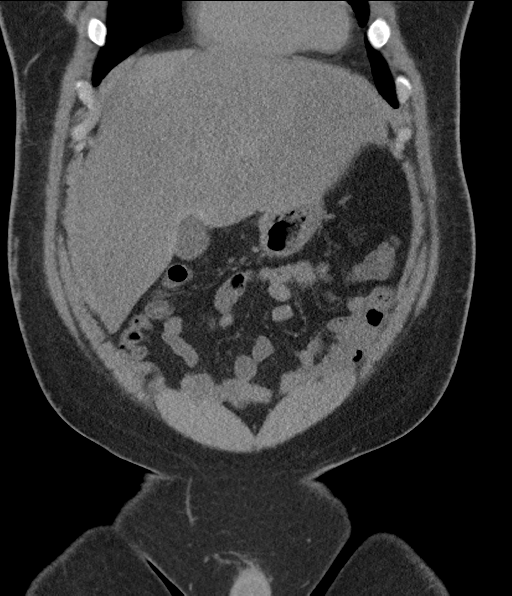
[im 54/121  soft-tissue]
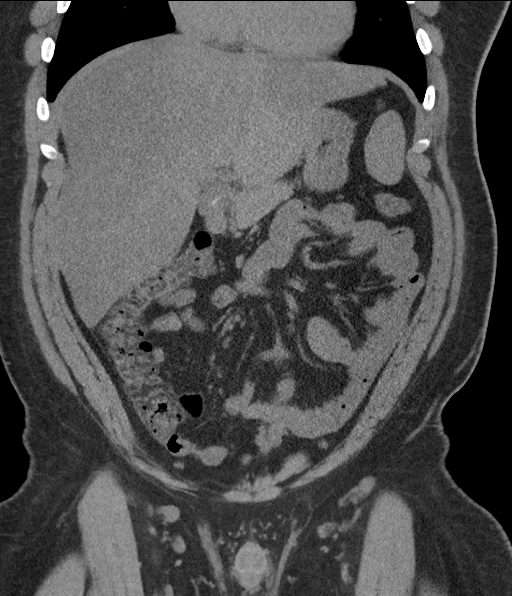
[im 67/121  soft-tissue]
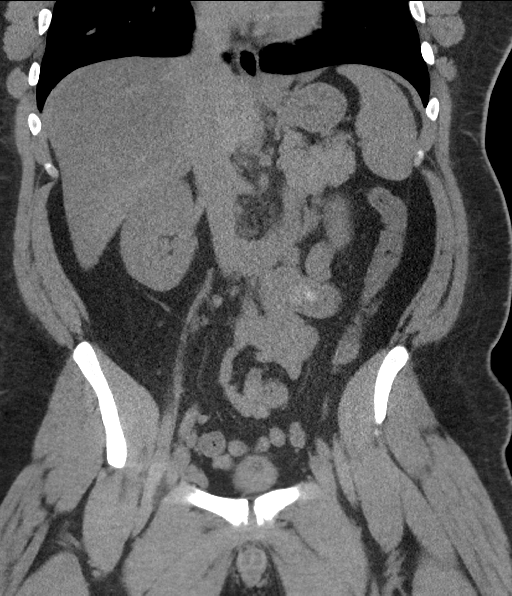

[16 of 46 positions shown; findings below may reference images not displayed]

FINDINGS: LOWER CHEST: There is no basilar pleural or apical pericardial
effusion.

HEPATOBILIARY: Diffuse hypoattenuation of the liver relative to the
spleen suggests hepatic steatosis. No focal liver lesion or biliary
dilatation. The gallbladder is normal.

PANCREAS: The pancreatic parenchymal contours are normal and there
is no ductal dilatation. There is no peripancreatic fluid
collection.

SPLEEN: Normal.

ADRENALS/URINARY TRACT:

--Adrenal glands: Normal.

--Right kidney/ureter: No hydronephrosis, nephroureterolithiasis,
perinephric stranding or solid renal mass.

--Left kidney/ureter: No hydronephrosis, nephroureterolithiasis,
perinephric stranding or solid renal mass.

--Urinary bladder: Normal for degree of distention

STOMACH/BOWEL:

--Stomach/Duodenum: There is no hiatal hernia or other gastric
abnormality. The duodenal course and caliber are normal.

--Small bowel: No dilatation or inflammation.

--Colon: No focal abnormality.

--Appendix: Normal.

VASCULAR/LYMPHATIC: Normal course and caliber of the major abdominal
vessels. No abdominal or pelvic lymphadenopathy.

REPRODUCTIVE: Normal prostate size with symmetric seminal vesicles.

MUSCULOSKELETAL. No bony spinal canal stenosis or focal osseous
abnormality. Sclerotic focus within the L4 vertebral body, likely
enostosis.

OTHER: None.
IMPRESSION: 1. No obstructive uropathy or nephrolithiasis.
2. Hepatic steatosis.

## 2020-08-12 IMAGING — DX DG CHEST 1V PORT
1 series · 1 of 1 positions shown · non-contrast
Comparison: 01/17/2017

CLINICAL DATA: Covert positive. Shortness of breath for 6 days.
Productive cough and clear phlegm.

EXAM:
PORTABLE CHEST 1 VIEW

[chest]
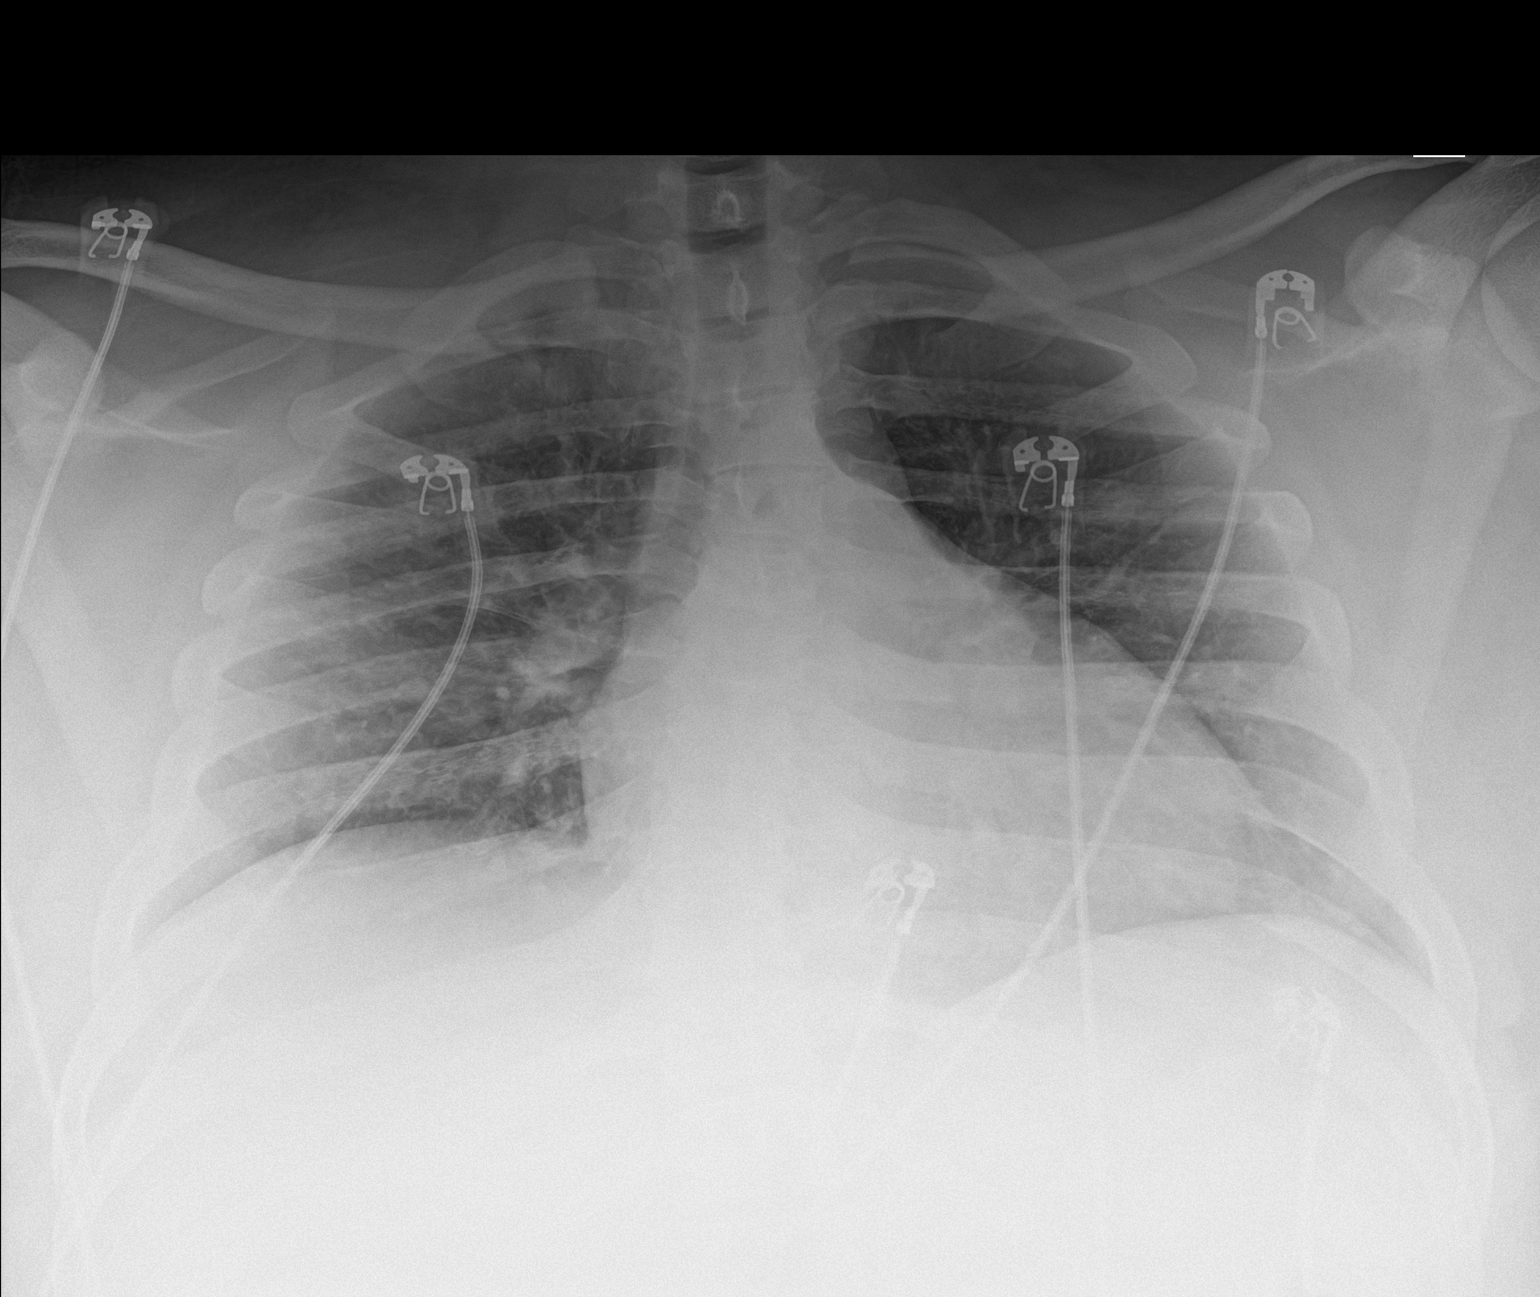

[1 of 1 positions shown; findings below may reference images not displayed]

FINDINGS: The heart size and mediastinal contours are within normal limits.
Diffuse low lung volumes. Asymmetric opacity within the right
midlung and right base concerning for pneumonia. The visualized
skeletal structures are unremarkable.
IMPRESSION: 1. Right mid and lower lungs airspace opacity compatible with
pneumonia.

## 2023-12-05 ENCOUNTER — Ambulatory Visit (INDEPENDENT_AMBULATORY_CARE_PROVIDER_SITE_OTHER)

## 2023-12-05 ENCOUNTER — Encounter (HOSPITAL_COMMUNITY): Payer: Self-pay

## 2023-12-05 ENCOUNTER — Ambulatory Visit (HOSPITAL_COMMUNITY)
Admission: EM | Admit: 2023-12-05 | Discharge: 2023-12-05 | Disposition: A | Discharge status: Home / Self Care | Attending: Physician Assistant | Admitting: Physician Assistant

## 2023-12-05 DIAGNOSIS — M79604 Pain in right leg: Secondary | ICD-10-CM | POA: Diagnosis not present

## 2023-12-05 DIAGNOSIS — S76101A Unspecified injury of right quadriceps muscle, fascia and tendon, initial encounter: Secondary | ICD-10-CM

## 2023-12-05 DIAGNOSIS — S76109A Unspecified injury of unspecified quadriceps muscle, fascia and tendon, initial encounter: Secondary | ICD-10-CM | POA: Diagnosis not present

## 2023-12-05 MED ORDER — HYDROCODONE-ACETAMINOPHEN 5-325 MG PO TABS: 0.5000 | ORAL_TABLET | Freq: Two times a day (BID) | ORAL | 0 refills | Status: AC | PRN

## 2023-12-05 MED ORDER — IBUPROFEN 800 MG PO TABS: 800.0000 mg | ORAL_TABLET | Freq: Three times a day (TID) | ORAL | 0 refills | Status: AC

## 2023-12-05 MED ORDER — IBUPROFEN 800 MG PO TABS
800.0000 mg | ORAL_TABLET | Freq: Once | ORAL | Status: AC
  Administered 2023-12-05: 800 mg via ORAL

## 2023-12-05 MED ORDER — IBUPROFEN 800 MG PO TABS
ORAL_TABLET | ORAL | Status: AC
  Filled 2023-12-05: qty 1

## 2023-12-05 NOTE — Discharge Instructions (Signed)
 Your x-ray did not show any broken bones or anything out of place.  I am concerned that you have injured your quadricep.  Use the knee immobilizer and crutches to avoid putting weight on this.  Take ibuprofen every 8 hours for pain relief.  I have called in a few doses of hydrocodone.  This medication is addictive and sedating so please try to limit use as much as possible.  It is very important that you follow-up with orthopedics as soon as possible; call to schedule appointment tomorrow.  If anything worsens and you have increasing pain, numbness or tingling in your leg, cold sensation in your leg, discoloration of your leg you need to go to the emergency room immediately.

## 2023-12-05 NOTE — ED Triage Notes (Signed)
 Patient states he was going down stairs today and felt his right knee pop. Patient has swelling to the right knee.  Patient states that he has not had any medication for his pain.

## 2023-12-05 NOTE — ED Provider Notes (Signed)
 [facility]    CSN: [id number] Arrival date & time: 12/05/23  1114      History   Chief Complaint Chief Complaint  Patient presents with   Knee Pain    HPI Tristan Vance is a 28 y.o. male.   Patient presents today with a several hour history of significant right knee pain.  Reports that he was coming down the stairs when he stepped with the majority of his weight onto his right leg and felt that his right knee gave out and has had ongoing pain since that time.  He has been unable to bear weight since the injury.  He reports that pain is rated 10 on a 0-10 pain scale, described as sharp, worse with palpation or attempted movement, no alleviating factors identified.  Denies any previous injury or surgery involving the knee.  He has not tried any over-the-counter medication for symptom management.    Past Medical History:  Diagnosis Date   Acid reflux    Asthma    Sleep apnea     Patient Active Problem List   Diagnosis Date Noted   GERD (gastroesophageal reflux disease) 12/11/2013    Past Surgical History:  Procedure Laterality Date   TONSILLECTOMY         Home Medications    Prior to Admission medications   Medication Sig Start Date End Date Taking? Authorizing Provider  HYDROcodone-acetaminophen (NORCO/VICODIN) 5-325 MG tablet Take 0.5-1 tablets by mouth 2 (two) times daily as needed for up to 2 days. 12/05/23 12/07/23 Yes Shantea Poulton K, PA-C  ibuprofen (ADVIL) 800 MG tablet Take 1 tablet (800 mg total) by mouth 3 (three) times daily. 12/05/23  Yes Kimala Horne K, PA-C  cetirizine (ZYRTEC) 10 MG tablet Take 10 mg by mouth daily.    [provider]  esomeprazole (NEXIUM) 40 MG capsule Take 1 capsule (40 mg total) by mouth daily. Patient not taking: Reported on 01/18/2019 11/01/13   Burgess Amor, PA-C  omeprazole (PRILOSEC) 20 MG capsule Take 20 mg by mouth daily.    [provider]    Family History Family History  Problem Relation Age of  Onset   Lactose intolerance Father    GER disease Paternal Uncle    Celiac disease Neg Hx    Ulcers Neg Hx    Cholelithiasis Neg Hx     Social History Social History   Tobacco Use   Smoking status: Never   Smokeless tobacco: Never  Vaping Use   Vaping status: Never Used  Substance Use Topics   Alcohol use: No   Drug use: No     Allergies   Patient has no known allergies.   Review of Systems Review of Systems  Constitutional:  Positive for activity change. Negative for appetite change, fatigue and fever.  Musculoskeletal:  Positive for arthralgias and gait problem. Negative for joint swelling and myalgias.  Skin:  Negative for color change, rash and wound.  Neurological:  Positive for weakness. Negative for numbness.     Physical Exam Triage Vital Signs ED Triage Vitals [12/05/23 1316]  Encounter Vitals Group     BP 131/78     Systolic BP Percentile      Diastolic BP Percentile      Pulse Rate 72     Resp 16     Temp 97.9 F (36.6 C)     Temp Source Oral     SpO2 97 %     Weight  Height      Head Circumference      Peak Flow      Pain Score 10     Pain Loc      Pain Education      Exclude from Growth Chart    No data found.  Updated Vital Signs BP 131/78 (BP Location: Right Arm)   Pulse 72   Temp 97.9 F (36.6 C) (Oral)   Resp 16   SpO2 97%   Visual Acuity Right Eye Distance:   Left Eye Distance:   Bilateral Distance:    Right Eye Near:   Left Eye Near:    Bilateral Near:     Physical Exam Vitals reviewed.  Constitutional:      General: He is awake.     Appearance: Normal appearance. He is well-developed. He is not ill-appearing.     Comments: Very pleasant male appears stated age in no acute distress sitting comfortably in wheelchair  HENT:     Head: Normocephalic and atraumatic.  Cardiovascular:     Rate and Rhythm: Normal rate and regular rhythm.     Pulses:          Posterior tibial pulses are 2+ on the right side.      Heart sounds: Normal heart sounds, S1 normal and S2 normal. No murmur heard. Pulmonary:     Effort: Pulmonary effort is normal.     Breath sounds: Normal breath sounds. No stridor. No wheezing, rhonchi or rales.     Comments: Clear to auscultation bilaterally Musculoskeletal:     Right knee: Swelling present. No deformity. Decreased range of motion. Tenderness present. No LCL laxity, MCL laxity, ACL laxity or PCL laxity.     Comments: Right knee: Swelling noted right lateral distal thigh into knee.  No ligamentous laxity on exam.  Patient reports difficulty with active range of motion but has no significant pain with passive range of motion.  No ligamentous laxity on exam.  Foot neurovascular intact.  Neurological:     Mental Status: He is alert.  Psychiatric:        Behavior: Behavior is cooperative.      UC Treatments / Results  Labs (all labs ordered are listed, but only abnormal results are displayed) Labs Reviewed - No data to display  EKG   Radiology DG Knee Complete 4 Views Right Result Date: 12/05/2023 CLINICAL DATA:  Right knee injury with pain. EXAM: RIGHT KNEE - COMPLETE 4+ VIEW COMPARISON:  01/17/2017. FINDINGS: No evidence of acute fracture or dislocation. There is a small joint effusion. No evidence of arthropathy or other focal bone abnormality. Soft tissue swelling is noted along the anterior thigh. There is laxity of the patellar tendon and the quadriceps tendon is not well delineated. IMPRESSION: 1. No acute fracture or dislocation. 2. Small pleural effusion. 3. Laxity of the patellar tendon in the quadriceps tendon is not well seen. If there is concern for tendon rupture or tear, MRI is suggested. Electronically Signed   By: Thornell Sartorius M.D.   On: 12/05/2023 13:50    Procedures Procedures (including critical care time)  Medications Ordered in UC Medications  ibuprofen (ADVIL) tablet 800 mg (800 mg Oral Given 12/05/23 1412)    Initial Impression / Assessment and  Plan / UC Course  I have reviewed the triage vital signs and the nursing notes.  Pertinent labs & imaging results that were available during my care of the patient were reviewed by me and considered in my  medical decision making (see chart for details).     Patient is well-appearing, afebrile, nontoxic, nontachycardic.  Foot is neurovascular intact and no evidence of compartment syndrome on exam.  X-ray was obtained that showed no acute osseous abnormality.  Given he is having difficulty with knee extension I am concern for significant quadriceps injury including potentially tear.  Unfortunately, we do not have advanced imaging capabilities so patient was placed in a knee immobilizer and given crutches with instruction to follow-up with orthopedics tomorrow (12/06/2023).  He is to call first thing in the morning to schedule an appointment or attend their walk-in clinic.  We discussed the importance of following up as if there is a rupture of the quad tendon he would need surgery to which he expressed understanding.  He was given ibuprofen to help with pain with instructions take additional NSAIDs with this medication.  He was given a few doses of hydrocodone to help manage pain with instruction not to drive or drink alcohol while taking this medication as it can be sedating.  We discussed that it is addictive and so he should limit use is much as possible and was only given 4 tablets.  Review of [place] controlled substance database shows no inappropriate refills.  We discussed that if anything worsens or changes and he has increasing pain, swelling, discoloration, cold sensation, numbness or paresthesias in the foot he needs to go to the ER.  Strict return precautions given.  Patient expressed understanding and agreement with treatment plan.  Final Clinical Impressions(s) / UC Diagnoses   Final diagnoses:  Right leg pain  Injury of quadriceps muscle     Discharge Instructions      Your  x-ray did not show any broken bones or anything out of place.  I am concerned that you have injured your quadricep.  Use the knee immobilizer and crutches to avoid putting weight on this.  Take ibuprofen every 8 hours for pain relief.  I have called in a few doses of hydrocodone.  This medication is addictive and sedating so please try to limit use as much as possible.  It is very important that you follow-up with orthopedics as soon as possible; call to schedule appointment tomorrow.  If anything worsens and you have increasing pain, numbness or tingling in your leg, cold sensation in your leg, discoloration of your leg you need to go to the emergency room immediately.     ED Prescriptions     Medication Sig Dispense Auth. Provider   ibuprofen (ADVIL) 800 MG tablet Take 1 tablet (800 mg total) by mouth 3 (three) times daily. 21 tablet Geni Skorupski K, PA-C   HYDROcodone-acetaminophen (NORCO/VICODIN) 5-325 MG tablet Take 0.5-1 tablets by mouth 2 (two) times daily as needed for up to 2 days. 4 tablet Tycen Dockter K, PA-C      I have reviewed the PDMP during this encounter.   Jeani Hawking, PA-C 12/05/23 1421
